# Patient Record
Sex: Female | Born: 1965 | Race: Black or African American | Hispanic: No
Health system: Southern US, Community
[De-identification: ages and names within clinical notes are randomized; demographics above are authoritative.]

## PROBLEM LIST (undated history)

## (undated) DIAGNOSIS — I10 Essential (primary) hypertension: Secondary | ICD-10-CM

---

## 2011-01-06 ENCOUNTER — Ambulatory Visit: Payer: Self-pay | Admitting: Family Medicine

## 2011-01-22 ENCOUNTER — Ambulatory Visit: Payer: Self-pay | Admitting: Family Medicine

## 2012-04-13 ENCOUNTER — Ambulatory Visit: Payer: Self-pay | Admitting: Family Medicine

## 2013-03-13 ENCOUNTER — Emergency Department: Payer: Self-pay | Admitting: Emergency Medicine

## 2013-07-26 ENCOUNTER — Ambulatory Visit: Payer: Self-pay | Admitting: Family Medicine

## 2014-09-25 ENCOUNTER — Ambulatory Visit: Payer: Self-pay | Admitting: Family Medicine

## 2015-02-22 ENCOUNTER — Ambulatory Visit: Payer: Self-pay | Admitting: Family Medicine

## 2015-06-27 ENCOUNTER — Other Ambulatory Visit: Payer: Self-pay | Admitting: Family Medicine

## 2015-06-27 DIAGNOSIS — Z1231 Encounter for screening mammogram for malignant neoplasm of breast: Secondary | ICD-10-CM

## 2015-06-28 ENCOUNTER — Ambulatory Visit
Admission: RE | Admit: 2015-06-28 | Discharge: 2015-06-28 | Disposition: A | Discharge status: Home / Self Care | Payer: BC Managed Care – PPO | Source: Ambulatory Visit | Attending: Family Medicine | Admitting: Family Medicine

## 2015-06-28 DIAGNOSIS — Z1231 Encounter for screening mammogram for malignant neoplasm of breast: Secondary | ICD-10-CM
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
## 2016-08-18 ENCOUNTER — Other Ambulatory Visit: Payer: Self-pay | Admitting: Family Medicine

## 2016-08-18 DIAGNOSIS — Z1231 Encounter for screening mammogram for malignant neoplasm of breast: Secondary | ICD-10-CM

## 2016-09-02 ENCOUNTER — Ambulatory Visit
Admission: RE | Admit: 2016-09-02 | Discharge: 2016-09-02 | Disposition: A | Discharge status: Home / Self Care | Payer: BC Managed Care – PPO | Source: Ambulatory Visit | Attending: Family Medicine | Admitting: Family Medicine

## 2016-09-02 ENCOUNTER — Other Ambulatory Visit: Payer: Self-pay | Admitting: Family Medicine

## 2016-09-02 DIAGNOSIS — Z1231 Encounter for screening mammogram for malignant neoplasm of breast: Secondary | ICD-10-CM | POA: Insufficient documentation

## 2017-08-10 ENCOUNTER — Emergency Department: Payer: BLUE CROSS/BLUE SHIELD

## 2017-08-10 ENCOUNTER — Encounter: Payer: Self-pay | Admitting: Emergency Medicine

## 2017-08-10 ENCOUNTER — Emergency Department
Admission: EM | Admit: 2017-08-10 | Discharge: 2017-08-10 | Disposition: A | Discharge status: Home / Self Care | Payer: BLUE CROSS/BLUE SHIELD | Attending: Emergency Medicine | Admitting: Emergency Medicine

## 2017-08-10 DIAGNOSIS — I10 Essential (primary) hypertension: Secondary | ICD-10-CM | POA: Diagnosis not present

## 2017-08-10 DIAGNOSIS — R079 Chest pain, unspecified: Secondary | ICD-10-CM

## 2017-08-10 DIAGNOSIS — Z79899 Other long term (current) drug therapy: Secondary | ICD-10-CM | POA: Insufficient documentation

## 2017-08-10 HISTORY — DX: Essential (primary) hypertension: I10

## 2017-08-10 LAB — CBC
HCT: 35.1 % (ref 35.0–47.0)
HEMOGLOBIN: 11.6 g/dL — AB (ref 12.0–16.0)
MCH: 28.3 pg (ref 26.0–34.0)
MCHC: 33.1 g/dL (ref 32.0–36.0)
MCV: 85.5 fL (ref 80.0–100.0)
PLATELETS: 270 10*3/uL (ref 150–440)
RBC: 4.1 MIL/uL (ref 3.80–5.20)
RDW: 13.7 % (ref 11.5–14.5)
WBC: 4.3 10*3/uL (ref 3.6–11.0)

## 2017-08-10 LAB — TROPONIN I

## 2017-08-10 LAB — BASIC METABOLIC PANEL
ANION GAP: 8 (ref 5–15)
BUN: 17 mg/dL (ref 6–20)
CALCIUM: 9.1 mg/dL (ref 8.9–10.3)
CO2: 27 mmol/L (ref 22–32)
Chloride: 106 mmol/L (ref 101–111)
Creatinine, Ser: 0.64 mg/dL (ref 0.44–1.00)
GFR calc Af Amer: >60 mL/min (ref >60)
GFR calc non Af Amer: >60 mL/min (ref >60)
GLUCOSE: 96 mg/dL (ref 65–99)
POTASSIUM: 3.9 mmol/L (ref 3.5–5.1)
SODIUM: 141 mmol/L (ref 135–145)

## 2017-08-10 MED ORDER — ASPIRIN 81 MG PO CHEW
324.0000 mg | CHEWABLE_TABLET | Freq: Once | ORAL | Status: AC
  Administered 2017-08-10: 324 mg via ORAL
  Filled 2017-08-10: qty 4

## 2017-08-10 MED ORDER — ONDANSETRON HCL 4 MG/2ML IJ SOLN
4.0000 mg | Freq: Once | INTRAMUSCULAR | Status: AC
  Administered 2017-08-10: 4 mg via INTRAVENOUS
  Filled 2017-08-10: qty 2

## 2017-08-10 MED ORDER — ASPIRIN 81 MG PO CHEW
81.0000 mg | CHEWABLE_TABLET | Freq: Every day | ORAL | 0 refills | Status: AC
Start: 2017-08-10 — End: 2018-08-10

## 2017-08-10 MED ORDER — MORPHINE SULFATE (PF) 4 MG/ML IV SOLN
4.0000 mg | Freq: Once | INTRAVENOUS | Status: AC
Start: 2017-08-10 — End: 2017-08-10
  Administered 2017-08-10: 4 mg via INTRAVENOUS
  Filled 2017-08-10: qty 1

## 2017-08-10 NOTE — ED Notes (Signed)
Gave pt urinal to use bathroom 

## 2017-08-10 NOTE — ED Notes (Addendum)
Pt states she has chest pain that started around 2pm today. Family at bedside. Hx of HTN.

## 2017-08-10 NOTE — ED Provider Notes (Signed)
[facility] Emergency Department Provider Note  ____________________________________________   First MD Initiated Contact with Patient 08/10/17 1606     (approximate)  I have reviewed the triage vital signs and the nursing notes.   HISTORY  Chief Complaint Chest Pain   HPI Chloe Rios is a 51 y.o. female with history of hypertension and was presents to emergency department with sudden onset left-sided chest pain which started at about 2 PM. She describes the pain as sharp and constant. It is associated with mild shortness of breath. Patient denies any nausea, vomiting or diaphoresis. Says the pain radiates to her left shoulder as well as the proximal part of the left arm. Patient says that she has a history of anxiety and panic attacks but does not feel anxious or panicked at this time. She also took a clonazepam at home, which she takes for anxiety, without relief. The patient denies the pain worsening with exertion. Denies the pain worsening with movement of her left upper extremity. Patient denies any injury or excess exertional activity lately or heavy lifting.Says she was playing game on her phone when the pain started. Patient says that she has had a stress test in the past several years ago. She has never required catheterization. Denies any heart attacks in her immediate family.   Past Medical History:  Diagnosis Date  . Hypertension     There are no active problems to display for this patient.   No past surgical history on file.  Prior to Admission medications   Medication Sig Start Date End Date Taking? Authorizing Provider  amphetamine-dextroamphetamine (ADDERALL) 20 MG tablet Take 20 mg by mouth 2 (two) times daily. 06/21/17  Yes [provider]  BELSOMRA 15 MG TABS Take 15 mg by mouth at bedtime. 07/09/17  Yes [provider]  citalopram (CELEXA) 20 MG tablet Take 20 mg by mouth daily. 04/12/12  Yes [provider]  clonazePAM (KLONOPIN) 0.5 MG tablet Take 0.5 mg by mouth daily. 04/12/12  Yes [provider]  cyanocobalamin (,VITAMIN B-12,) 1000 MCG/ML injection Inject 1,000 mcg into the skin every 30 (thirty) days. 06/27/17  Yes [provider]  cyclobenzaprine (FLEXERIL) 5 MG tablet Take 5 mg by mouth daily. 07/06/17  Yes [provider]  DEXILANT 60 MG capsule Take 60 mg by mouth daily. 07/13/17  Yes [provider]  fluticasone (FLONASE) 50 MCG/ACT nasal spray Place 1 spray into both nostrils daily as needed for allergies or rhinitis.   Yes [provider]  LINZESS 145 MCG CAPS capsule Take 145 mg by mouth at bedtime.  08/02/17  Yes [provider]  losartan (COZAAR) 100 MG tablet Take 100 mg by mouth daily. 06/15/17  Yes [provider]  meloxicam (MOBIC) 15 MG tablet Take 15 mg by mouth daily as needed. 06/27/17   [provider]    Allergies Pineapple; Penicillins; and Sulfa antibiotics  Family History  Problem Relation Age of Onset  . Breast cancer Paternal Aunt 76    Social History Social History  Substance Use Topics  . Smoking status: Not on file  . Smokeless tobacco: Not on file  . Alcohol use Not on file    Review of Systems  Constitutional: No fever/chills Eyes: No visual changes. ENT: No sore throat. Cardiovascular: as above Respiratory: as above  Gastrointestinal: No abdominal pain.  No nausea, no vomiting.  No diarrhea.  No constipation. Genitourinary: Negative for dysuria. Musculoskeletal: Negative for back pain. Skin:  Negative for rash. Neurological: Negative for headaches, focal weakness or numbness.   ____________________________________________   PHYSICAL EXAM:  VITAL SIGNS: ED Triage Vitals  Enc Vitals Group     BP 08/10/17 1615 127/88     Pulse Rate 08/10/17 1615 78     Resp 08/10/17 1615 18     Temp --      Temp src --      SpO2 08/10/17 1615 100 %     Weight 08/10/17 1548  179 lb (81.2 kg)     Height 08/10/17 1548 5\' 4"  (1.626 m)     Head Circumference --      Peak Flow --      Pain Score 08/10/17 1548 8     Pain Loc --      Pain Edu? --      Excl. in GC? --     Constitutional: Alert and oriented. Well appearing and in no acute distress. Eyes: Conjunctivae are normal.  Head: Atraumatic. Nose: No congestion/rhinnorhea. Mouth/Throat: Mucous membranes are moist.  Neck: No stridor.   Cardiovascular: Normal rate, regular rhythm. Grossly normal heart sounds.  Good peripheral circulation In the bilateral radial as well as dorsalis pedis pulses.  Tenderness to palpation of the left pectoralis major muscle which the patient says reproduces the pain. Respiratory: Normal respiratory effort.  No retractions. Lungs CTAB. Gastrointestinal: Soft and nontender. No distention. No CVA tenderness. Musculoskeletal: No lower extremity tenderness nor edema.  No joint effusions. Neurologic:  Normal speech and language. No gross focal neurologic deficits are appreciated. Skin:  Skin is warm, dry and intact. No rash noted. Psychiatric: Mood and affect are normal. Speech and behavior are normal.  ____________________________________________   LABS (all labs ordered are listed, but only abnormal results are displayed)  Labs Reviewed  CBC - Abnormal; Notable for the following:       Result Value   Hemoglobin 11.6 (*)    All other components within normal limits  BASIC METABOLIC PANEL  TROPONIN I  TROPONIN I   ____________________________________________  EKG  ED ECG REPORT I, Arelia Longest, the attending physician, personally viewed and interpreted this ECG.   Date: 08/10/2017  EKG Time: 1551  Rate: 79  Rhythm: normal sinus rhythm  Axis: normal  Intervals:none  ST&T Change: No ST segment elevation or depression. No abnormal T-wave inversion.  ____________________________________________  RADIOLOGY  Normal  chest ____________________________________________   PROCEDURES  Procedure(s) performed:   Procedures  Critical Care performed:   ____________________________________________   INITIAL IMPRESSION / ASSESSMENT AND PLAN / ED COURSE  Pertinent labs & imaging results that were available during my care of the patient were reviewed by me and considered in my medical decision making (see chart for details).  Reassuring vitals as well as EKG. Reproducible tenderness over the chest wall.  Heart score of 2.   Atypical presentation for PE and ACS. Heart score of 2. First set of labs very reassuring. We will trend her troponins and reassess after morphine.    ----------------------------------------- 6:23 PM on 08/10/2017 -----------------------------------------  Patient says that her pain is now a 5 out of 10 after morphine. Says that she has no longer feeling short of breath. Still with tenderness to palpation over the left pectoralis major muscle. Patient without any distress at this time. 2 negative troponins. I had a discussion with the patient about next steps. Because of the patient's very reassuring workup today I believe the patient can be discharged safely to home with  cardiology follow-up. She will try Biofreeze which she has at home over the left pectoralis major muscle. We'll start her on a baby aspirin. She says that she has had a stress test in the past but it has been several years. She said it was for similar chest pain and she did not require a catheterization thereafter. She knows to return to the emergency department for any worsening or concerning symptoms. She is understanding of the diagnosis as well as the plan is willing to comply.  ____________________________________________   FINAL CLINICAL IMPRESSION(S) / ED DIAGNOSES  Chest pain    NEW MEDICATIONS STARTED DURING THIS VISIT:  New Prescriptions   No medications on file     Note:  This document was  prepared using Dragon voice recognition software and may include unintentional dictation errors.     Myrna BlazerSchaevitz, Shea Kapur Matthew, MD 08/10/17 [phone]

## 2017-08-10 NOTE — ED Triage Notes (Signed)
Pt reports left sided chest pain that radiates thru to her back that started at 1400 today. Pt with some shortness of breath.

## 2017-10-13 ENCOUNTER — Other Ambulatory Visit: Payer: Self-pay | Admitting: Family Medicine

## 2017-10-13 DIAGNOSIS — Z1231 Encounter for screening mammogram for malignant neoplasm of breast: Secondary | ICD-10-CM

## 2017-10-28 ENCOUNTER — Ambulatory Visit
Admission: RE | Admit: 2017-10-28 | Discharge: 2017-10-28 | Disposition: A | Discharge status: Home / Self Care | Payer: BLUE CROSS/BLUE SHIELD | Source: Ambulatory Visit | Attending: Family Medicine | Admitting: Family Medicine

## 2017-10-28 DIAGNOSIS — Z1231 Encounter for screening mammogram for malignant neoplasm of breast: Secondary | ICD-10-CM | POA: Insufficient documentation

## 2018-10-19 ENCOUNTER — Other Ambulatory Visit: Payer: Self-pay | Admitting: Family Medicine

## 2018-10-20 ENCOUNTER — Other Ambulatory Visit: Payer: Self-pay | Admitting: Family Medicine

## 2018-10-20 DIAGNOSIS — N644 Mastodynia: Secondary | ICD-10-CM

## 2018-10-21 ENCOUNTER — Other Ambulatory Visit: Payer: Self-pay | Admitting: Family Medicine

## 2018-10-31 ENCOUNTER — Other Ambulatory Visit: Payer: Self-pay | Admitting: Family Medicine

## 2018-10-31 DIAGNOSIS — N644 Mastodynia: Secondary | ICD-10-CM

## 2018-11-07 ENCOUNTER — Ambulatory Visit
Admission: RE | Admit: 2018-11-07 | Discharge: 2018-11-07 | Disposition: A | Discharge status: Home / Self Care | Payer: BLUE CROSS/BLUE SHIELD | Source: Ambulatory Visit | Attending: Family Medicine | Admitting: Family Medicine

## 2018-11-07 DIAGNOSIS — N644 Mastodynia: Secondary | ICD-10-CM | POA: Diagnosis present

## 2019-11-10 ENCOUNTER — Other Ambulatory Visit: Payer: Self-pay | Admitting: Family Medicine

## 2019-11-10 DIAGNOSIS — Z1231 Encounter for screening mammogram for malignant neoplasm of breast: Secondary | ICD-10-CM

## 2020-03-08 ENCOUNTER — Ambulatory Visit
Admission: RE | Admit: 2020-03-08 | Discharge: 2020-03-08 | Disposition: A | Discharge status: Home / Self Care | Payer: BLUE CROSS/BLUE SHIELD | Source: Ambulatory Visit | Attending: Family Medicine | Admitting: Family Medicine

## 2020-03-08 DIAGNOSIS — Z1231 Encounter for screening mammogram for malignant neoplasm of breast: Secondary | ICD-10-CM | POA: Diagnosis not present

## 2020-06-17 ENCOUNTER — Other Ambulatory Visit: Payer: Self-pay

## 2021-12-13 IMAGING — MG DIGITAL SCREENING BILAT W/ TOMO W/ CAD
6 of 10 series · 6 of 30 positions shown · non-contrast
Comparison: Previous exam(s).

CLINICAL DATA: Screening.

EXAM:
DIGITAL SCREENING BILATERAL MAMMOGRAM WITH TOMO AND CAD

[R MLO synth-2D (1 of 2)]
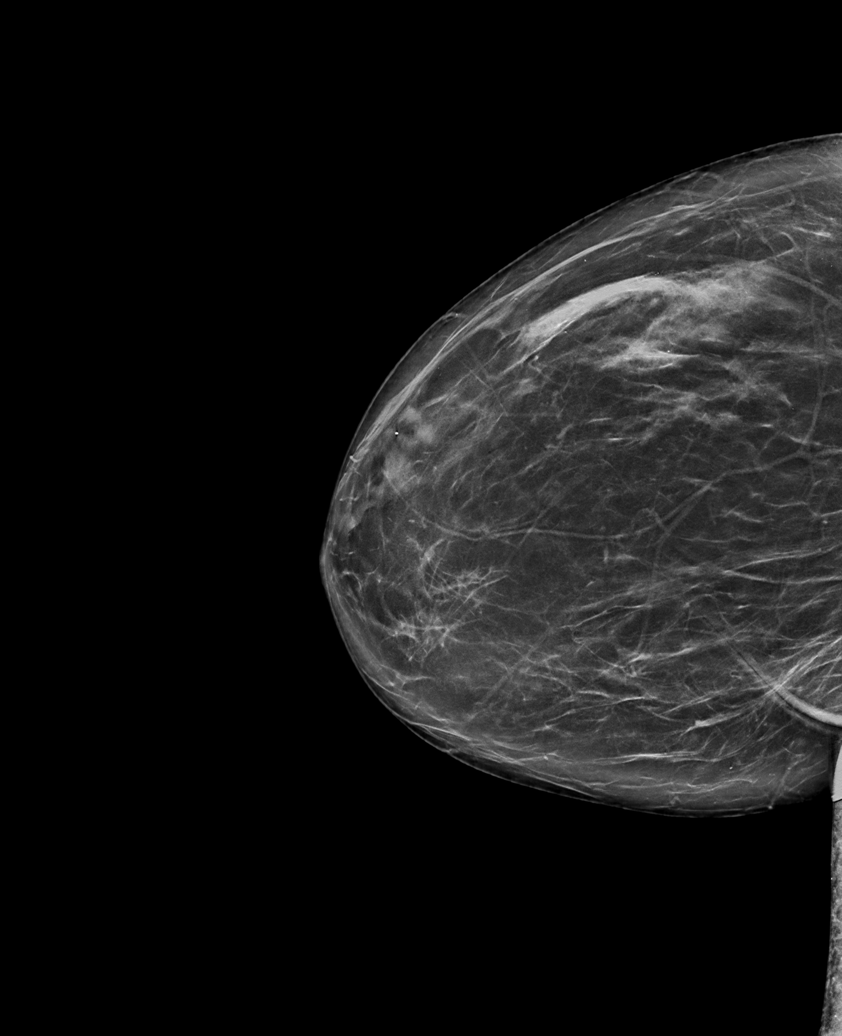

[L MLO synth-2D]
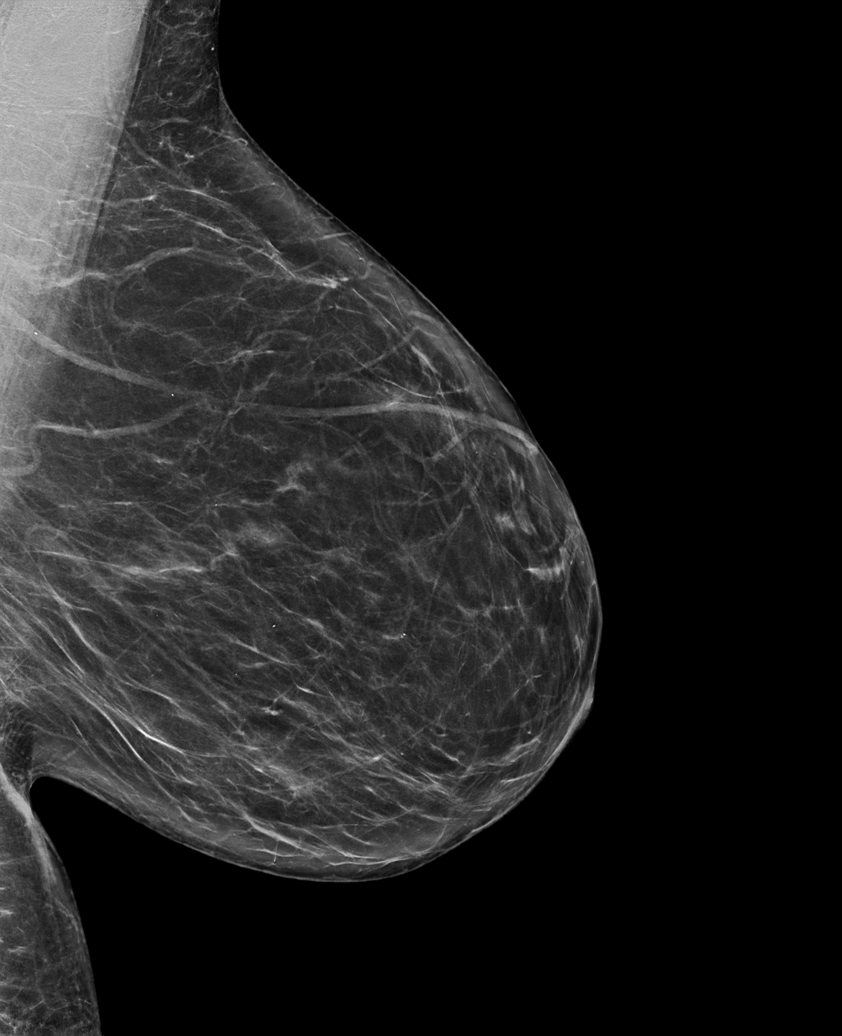

[R MLO synth-2D (2 of 2)]
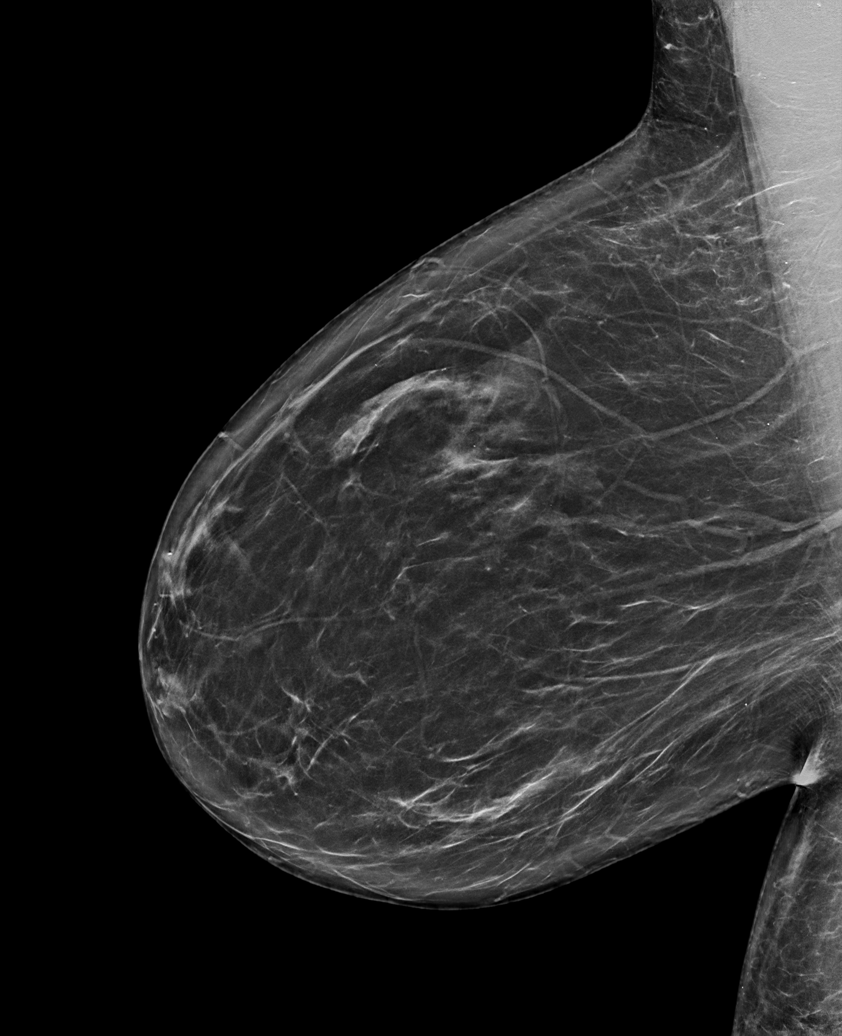

[R CC synth-2D]
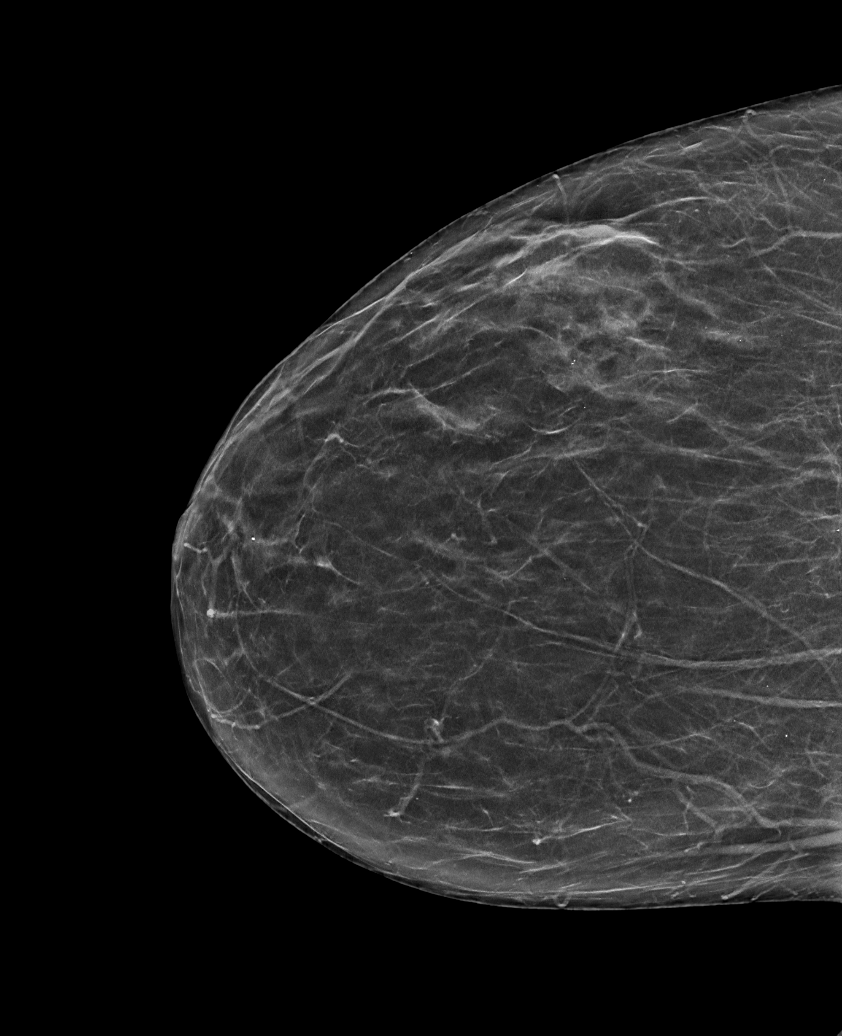

[L CC synth-2D]
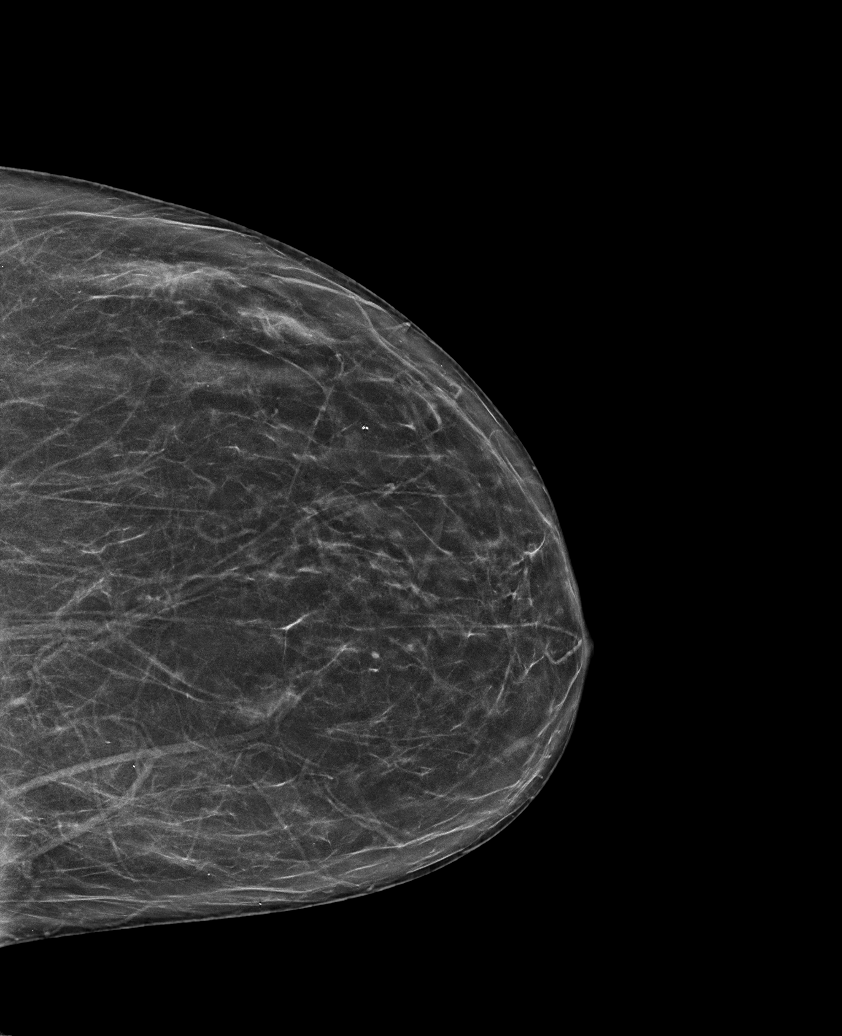

[R CC tomo · tomo slice 33/66.0]
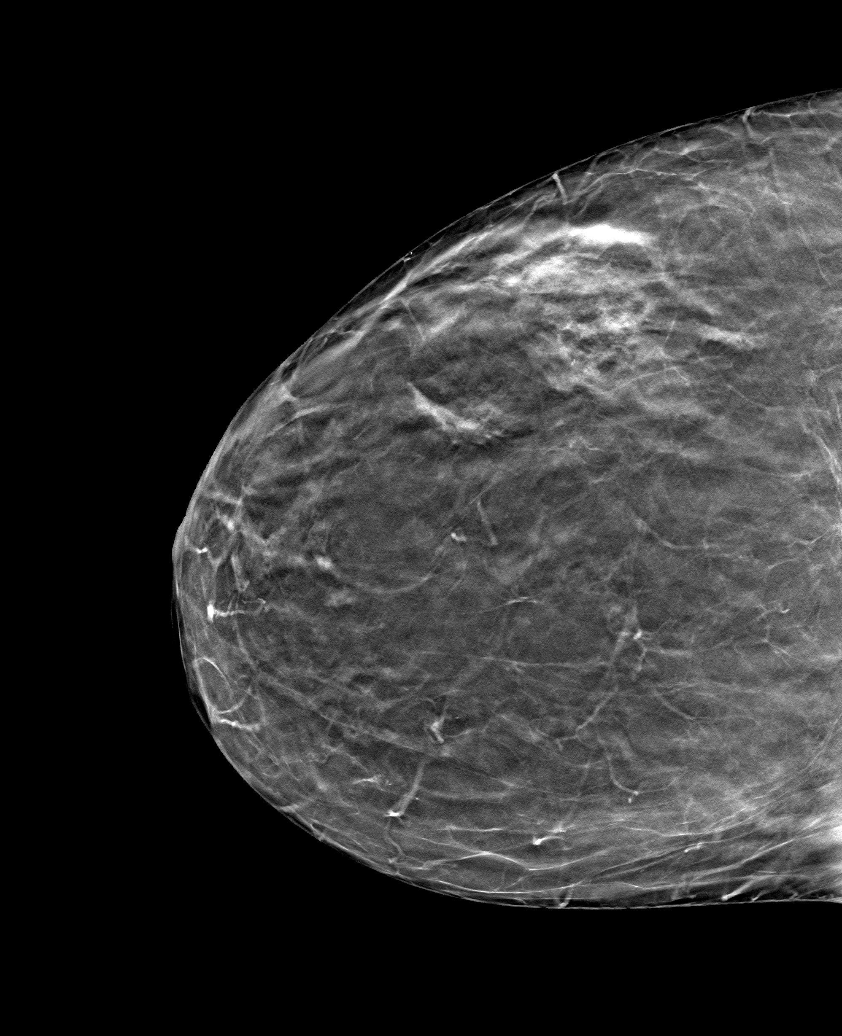

[6 of 30 positions shown; findings below may reference images not displayed]

ACR Breast Density Category b: There are scattered areas of
fibroglandular density.
FINDINGS: There are no findings suspicious for malignancy. Images were
processed with CAD.
IMPRESSION: No mammographic evidence of malignancy. A result letter of this
screening mammogram will be mailed directly to the patient.

RECOMMENDATION:
Screening mammogram in one year. (Code:[id number])

BI-RADS CATEGORY  1: Negative.
